# Patient Record
Sex: Male | Born: 2000 | Race: White | Hispanic: No | Marital: Single | Smoking: Never smoker
Health system: Southern US, Community
[De-identification: ages and names within clinical notes are randomized; demographics above are authoritative.]

## PROBLEM LIST (undated history)

## (undated) DIAGNOSIS — J45909 Unspecified asthma, uncomplicated: Secondary | ICD-10-CM

---
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
## 2019-03-27 ENCOUNTER — Other Ambulatory Visit: Payer: Self-pay

## 2019-03-27 ENCOUNTER — Emergency Department (HOSPITAL_BASED_OUTPATIENT_CLINIC_OR_DEPARTMENT_OTHER): Payer: Managed Care, Other (non HMO)

## 2019-03-27 ENCOUNTER — Emergency Department (HOSPITAL_BASED_OUTPATIENT_CLINIC_OR_DEPARTMENT_OTHER)
Admission: EM | Admit: 2019-03-27 | Discharge: 2019-03-27 | Disposition: A | Discharge status: Home / Self Care | Payer: Managed Care, Other (non HMO) | Attending: Emergency Medicine | Admitting: Emergency Medicine

## 2019-03-27 ENCOUNTER — Encounter (HOSPITAL_BASED_OUTPATIENT_CLINIC_OR_DEPARTMENT_OTHER): Payer: Self-pay

## 2019-03-27 DIAGNOSIS — Z79899 Other long term (current) drug therapy: Secondary | ICD-10-CM | POA: Diagnosis not present

## 2019-03-27 DIAGNOSIS — Z91010 Allergy to peanuts: Secondary | ICD-10-CM | POA: Insufficient documentation

## 2019-03-27 DIAGNOSIS — J45909 Unspecified asthma, uncomplicated: Secondary | ICD-10-CM | POA: Diagnosis not present

## 2019-03-27 DIAGNOSIS — W25XXXA Contact with sharp glass, initial encounter: Secondary | ICD-10-CM | POA: Insufficient documentation

## 2019-03-27 DIAGNOSIS — Y92828 Other wilderness area as the place of occurrence of the external cause: Secondary | ICD-10-CM | POA: Diagnosis not present

## 2019-03-27 DIAGNOSIS — Y998 Other external cause status: Secondary | ICD-10-CM | POA: Diagnosis not present

## 2019-03-27 DIAGNOSIS — Y9389 Activity, other specified: Secondary | ICD-10-CM | POA: Diagnosis not present

## 2019-03-27 DIAGNOSIS — S91312A Laceration without foreign body, left foot, initial encounter: Secondary | ICD-10-CM | POA: Diagnosis not present

## 2019-03-27 DIAGNOSIS — Z23 Encounter for immunization: Secondary | ICD-10-CM | POA: Insufficient documentation

## 2019-03-27 DIAGNOSIS — S91311A Laceration without foreign body, right foot, initial encounter: Secondary | ICD-10-CM

## 2019-03-27 HISTORY — DX: Unspecified asthma, uncomplicated: J45.909

## 2019-03-27 MED ORDER — TETANUS-DIPHTH-ACELL PERTUSSIS 5-2.5-18.5 LF-MCG/0.5 IM SUSP
0.5000 mL | Freq: Once | INTRAMUSCULAR | Status: AC
  Administered 2019-03-27: 0.5 mL via INTRAMUSCULAR
  Filled 2019-03-27: qty 0.5

## 2019-03-27 MED ORDER — SULFAMETHOXAZOLE-TRIMETHOPRIM 800-160 MG PO TABS: 1.0000 | ORAL_TABLET | Freq: Two times a day (BID) | ORAL | 0 refills | Status: AC

## 2019-03-27 MED ORDER — SULFAMETHOXAZOLE-TRIMETHOPRIM 800-160 MG PO TABS
1.0000 | ORAL_TABLET | Freq: Once | ORAL | Status: AC
  Administered 2019-03-27: 1 via ORAL
  Filled 2019-03-27: qty 1

## 2019-03-27 MED ORDER — LIDOCAINE HCL 1 % IJ SOLN
INTRAMUSCULAR | Status: AC
  Administered 2019-03-27: 21:00:00 20 mL
  Filled 2019-03-27: qty 20

## 2019-03-27 MED ORDER — LIDOCAINE-EPINEPHRINE-TETRACAINE (LET) SOLUTION: 3.0000 mL | Freq: Once | NASAL | Status: DC

## 2019-03-27 NOTE — ED Triage Notes (Addendum)
Pt states he stepped on broken glass ~1.5hours PTA-lac noted to bottom of left foot-4x4/gauze applied-NAD-steady gait

## 2019-03-27 NOTE — Discharge Instructions (Addendum)
1. Medications: Tylenol or ibuprofen for pain, usual home medications ° °2. Treatment: ice for swelling, keep wound clean with warm soap and water and keep bandage dry, do not submerge in water for 24 hours ° °3. Follow Up: Please return in 10-14 days to have your stitches/staples removed or sooner if you have concerns. Return to the emergency department for increased redness, drainage of pus from the wound ° ° °WOUND CARE ° Keep area clean and dry for 24 hours. Do not remove bandage, if applied. ° After 24 hours, remove bandage and wash wound gently with mild soap and warm water. Reapply a new bandage after cleaning wound, if directed.  ° Continue daily cleansing with soap and water until stitches/staples are removed. ° Do not apply any ointments or creams to the wound while stitches/staples are in place, as this may cause delayed healing. °Return if you experience any of the following signs of infection: Swelling, redness, pus drainage, streaking, fever >101.0 F ° Return if you experience excessive bleeding that does not stop after 15-20 minutes of constant, firm pressure. ° °

## 2019-03-27 NOTE — ED Provider Notes (Signed)
[facility] EMERGENCY DEPARTMENT Provider Note   CSN: [id number] Arrival date & time: 03/27/19  2010    History   Chief Complaint Chief Complaint  Patient presents with  . Foot Injury    HPI Eric Bullock is a 18 y.o. male has medical history of asthma presents emergency department today with chief complaint of laceration to left foot.  This happened 1/2 hours prior to arrival.  [place] pain on the bottom of his foot.  He pulled it out and saw there [place] and he was bleeding.  He has pain localized to laceration, he rates it 6/10 in severity.  He not take anything for pain prior to arrival.  Patient states tetanus immunization is not up-to-date.   History provided by patient with additional history obtained from chart review.     Past Medical History:  Diagnosis Date  . Asthma     Patient Active Problem List   Diagnosis Date Noted  . Other allergic rhinitis 11/25/2015  . 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 Atopic dermatitis 11/25/2015  . Moderate persistent asthma 11/25/2015    History reviewed. No pertinent surgical history.      Home Medications    Prior to Admission medications   Medication Sig Start Date End Date Taking? Authorizing Provider  albuterol (PROAIR HFA) 108 (90 Base) MCG/ACT inhaler Inhale 2 puffs into the lungs every 4 (four) hours as needed for wheezing or shortness of breath.    [provider]  beclomethasone (QVAR) 80 MCG/ACT inhaler Inhale 2 puffs into the lungs once. 01/12/16   Mikki SanteeBhatti, Sokun, MD  cetirizine (ZYRTEC) 10 MG tablet Take 10 mg by mouth daily.    [provider]  diphenhydrAMINE (BENADRYL) 12.5 MG/5ML elixir Take 12.5 mg by mouth 4 (four) times daily as needed.    [provider]  EPINEPHrine (EPIPEN 2-PAK) 0.3 mg/0.3 mL IJ SOAJ injection Inject 0.3 mLs (0.3 mg total) into the muscle once.  11/25/15   Mikki SanteeBhatti, Sokun, MD  flunisolide (NASALIDE) 25 MCG/ACT (0.025%) SOLN Place 2 sprays into the nose daily. 11/25/15   Mikki SanteeBhatti, Sokun, MD  ketotifen (ZADITOR) 0.025 % ophthalmic solution Place 1 drop into both eyes 2 (two) times daily. 01/12/16   Mikki SanteeBhatti, Sokun, MD  mometasone (ELOCON) 0.1 % ointment Apply topically daily. 11/11/15   Mikki SanteeBhatti, Sokun, MD  montelukast (SINGULAIR) 10 MG tablet Take 1 tablet (10 mg total) by mouth at bedtime. 11/25/15   Mikki SanteeBhatti, Sokun, MD  NONFORMULARY OR COMPOUNDED ITEM     [provider]  sulfamethoxazole-trimethoprim (BACTRIM DS) 800-160 MG tablet Take 1 tablet by mouth 2 (two) times daily for 5 days. 03/27/19 04/01/19  , Caroleen HammanKaitlyn E, PA-C    Family History No family history on file.  Social History Social History   Tobacco Use  . Smoking status: Never Smoker  . Smokeless tobacco: Never Used  Substance Use Topics  . Alcohol use: No  . Drug use: No     Allergies   Other and Peanut-containing drug products   Review of Systems Review of Systems  Constitutional: Negative for chills and fever.  Musculoskeletal: Negative for gait problem.  Skin: Positive for wound.  Neurological: Negative for weakness and numbness.     Physical Exam Updated Vital Signs BP 129/89 (BP Location: Left Arm)   Pulse 64   Temp 98.7 F (37.1 C) (Oral)  Resp 18   Ht 5\' 10"  (1.778 m)   Wt 68 kg   SpO2 100%   BMI 21.52 kg/m   Physical Exam Vitals signs and nursing note reviewed.  Constitutional:      Appearance: He is well-developed. He is not ill-appearing or toxic-appearing.  HENT:     Head: Normocephalic and atraumatic.     Nose: Nose normal.  Eyes:     General: No scleral icterus.       Right eye: No discharge.        Left eye: No discharge.     Conjunctiva/sclera: Conjunctivae normal.  Neck:     Musculoskeletal: Normal range of motion.     Vascular: No JVD.  Cardiovascular:     Rate and Rhythm: Normal rate and regular rhythm.      Pulses: Normal pulses.     Heart sounds: Normal heart sounds.  Pulmonary:     Effort: Pulmonary effort is normal.     Breath sounds: Normal breath sounds.  Abdominal:     General: There is no distension.  Musculoskeletal: Normal range of motion.       Feet:  Skin:    General: Skin is warm and dry.  Neurological:     Mental Status: He is oriented to person, place, and time.     GCS: GCS eye subscore is 4. GCS verbal subscore is 5. GCS motor subscore is 6.     Comments: Fluent speech, no facial droop.  Psychiatric:        Behavior: Behavior normal.      ED Treatments / Results  Labs (all labs ordered are listed, but only abnormal results are displayed) Labs Reviewed - No data to display  EKG None  Radiology Dg Foot Complete Left  Result Date: 03/27/2019 CLINICAL DATA:  Stepped on glass with laceration. Question foreign body. EXAM: LEFT FOOT - COMPLETE 3+ VIEW COMPARISON:  None. FINDINGS: No evidence of fracture, dislocation or radiopaque foreign object. IMPRESSION: Negative radiographs. Electronically Signed   By: Nelson Chimes M.D.   On: 03/27/2019 21:57    Procedures .Marland KitchenLaceration Repair  Date/Time: 03/27/2019 10:59 PM Performed by: Cherre Robins, PA-C Authorized by: Cherre Robins, PA-C   Consent:    Consent obtained:  Verbal   Consent given by:  Patient   Risks discussed:  Infection   Alternatives discussed:  No treatment Universal protocol:    Procedure explained and questions answered to patient or proxy's satisfaction: yes     Immediately prior to procedure, a time out was called: yes     Patient identity confirmed:  Verbally with patient and arm band Anesthesia (see MAR for exact dosages):    Anesthesia method:  None Laceration details:    Location:  Foot   Foot location:  Sole of L foot   Length (cm):  4 Repair type:    Repair type:  Simple Pre-procedure details:    Preparation:  Imaging obtained to evaluate for foreign bodies and patient  was prepped and draped in usual sterile fashion Exploration:    Hemostasis achieved with:  Direct pressure   Wound exploration: wound explored through full range of motion and entire depth of wound probed and visualized     Contaminated: no   Treatment:    Area cleansed with:  Saline   Amount of cleaning:  Standard   Irrigation solution:  Sterile saline   Irrigation method:  Pressure wash Skin repair:    Repair method:  Sutures  Suture size:  3-0   Suture material:  Prolene   Number of sutures:  5 Approximation:    Approximation:  Close Post-procedure details:    Dressing:  Bulky dressing   Patient tolerance of procedure:  Tolerated well, no immediate complications   (including critical care time)  Medications Ordered in ED Medications  lidocaine-EPINEPHrine-tetracaine (LET) solution (has no administration in time range)  sulfamethoxazole-trimethoprim (BACTRIM DS) 800-160 MG per tablet 1 tablet (has no administration in time range)  Tdap (BOOSTRIX) injection 0.5 mL (0.5 mLs Intramuscular Given 03/27/19 2029)  lidocaine (XYLOCAINE) 1 % (with pres) injection (20 mLs  Given by Other 03/27/19 2031)     Initial Impression / Assessment and Plan / ED Course  I have reviewed the triage vital signs and the nursing notes.  Pertinent labs & imaging results that were available during my care of the patient were reviewed by me and considered in my medical decision making (see chart for details).  Patient presents to the emergency department with laceration to the plantar aspect of right foot which occurred within 8 hours PTA . Patient nontoxic appearing, resting comfortably. X-ray obtained in area of laceration, no fractures/dislocations or apparent radiopaque foreign bodies. Pressure irrigation performed. Wound explored and base of wound visualized in a bloodless field without evidence of foreign body. Laceration repair per procedure note above, tolerated well. Tetanus updated at today's  visit.  Discharge home with prescription for Bactrim. Discussed suture home care as well as need for wound recheck and suture removal in 10-14 days.  I discussed results, treatment plan, need for follow-up, and return precautions with the patient including signs of infection. Provided opportunity for questions, patient confirmed understanding and is in agreement with plan.    This note was prepared using Dragon voice recognition software and may include unintentional dictation errors due to the inherent limitations of voice recognition software.    Final Clinical Impressions(s) / ED Diagnoses   Final diagnoses:  Laceration of right foot, initial encounter    ED Discharge Orders         Ordered    sulfamethoxazole-trimethoprim (BACTRIM DS) 800-160 MG tablet  2 times daily     03/27/19 2251           Sherene Sireslbrizze,  E, PA-C 03/27/19 2300    Rolan BuccoBelfi, Melanie, MD 03/27/19 2329

## 2020-10-08 IMAGING — DX LEFT FOOT - COMPLETE 3+ VIEW
3 series · 3 of 3 positions shown · non-contrast
Comparison: None.

CLINICAL DATA: Stepped on glass with laceration. Question foreign
body.

EXAM:
LEFT FOOT - COMPLETE 3+ VIEW

[foot ap]
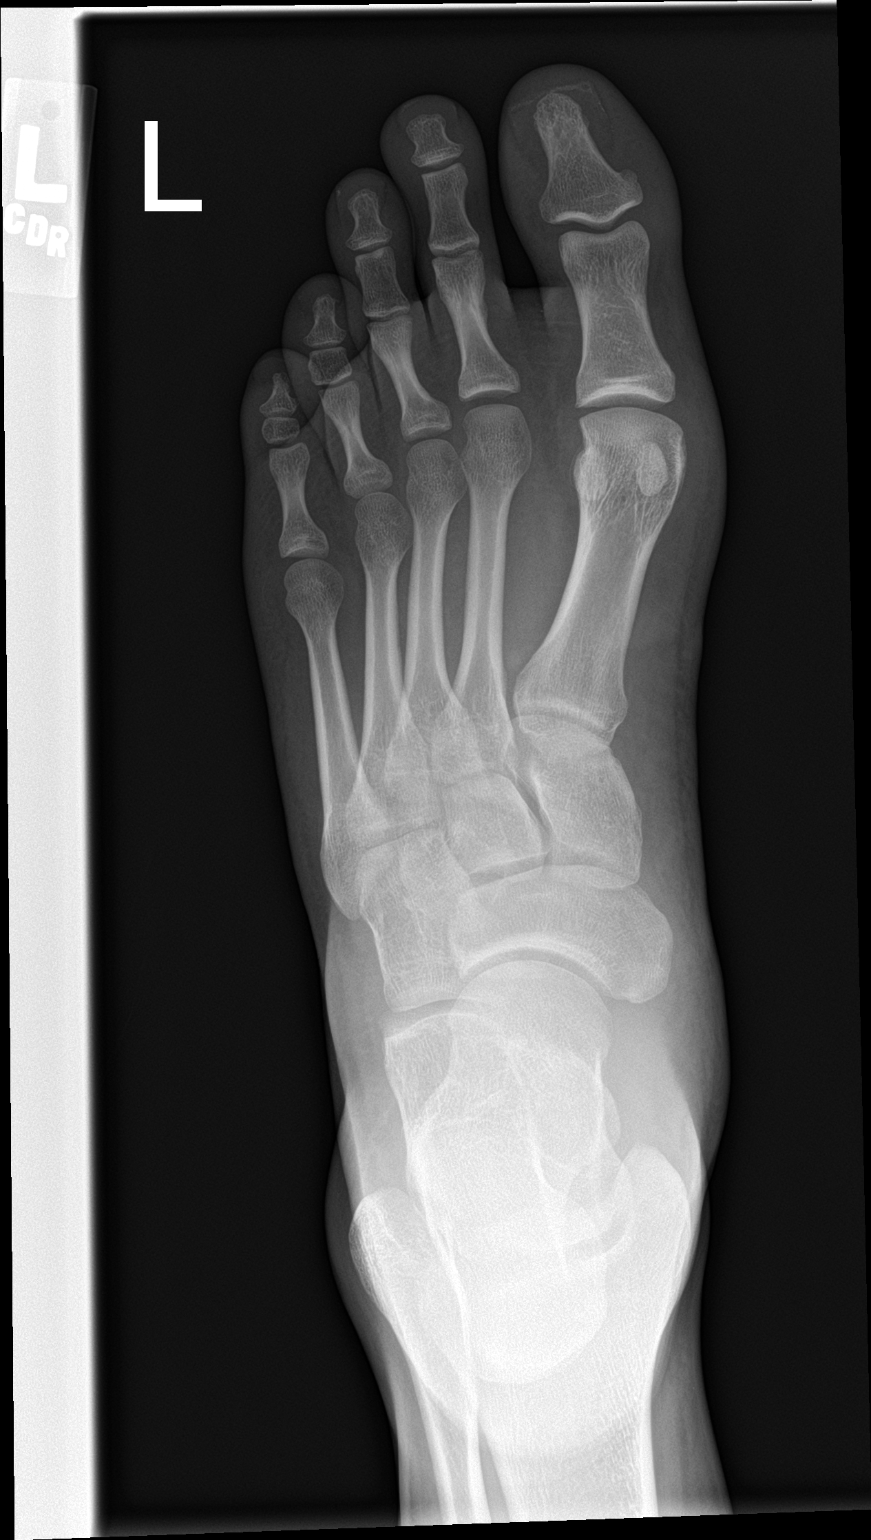

[foot obl]
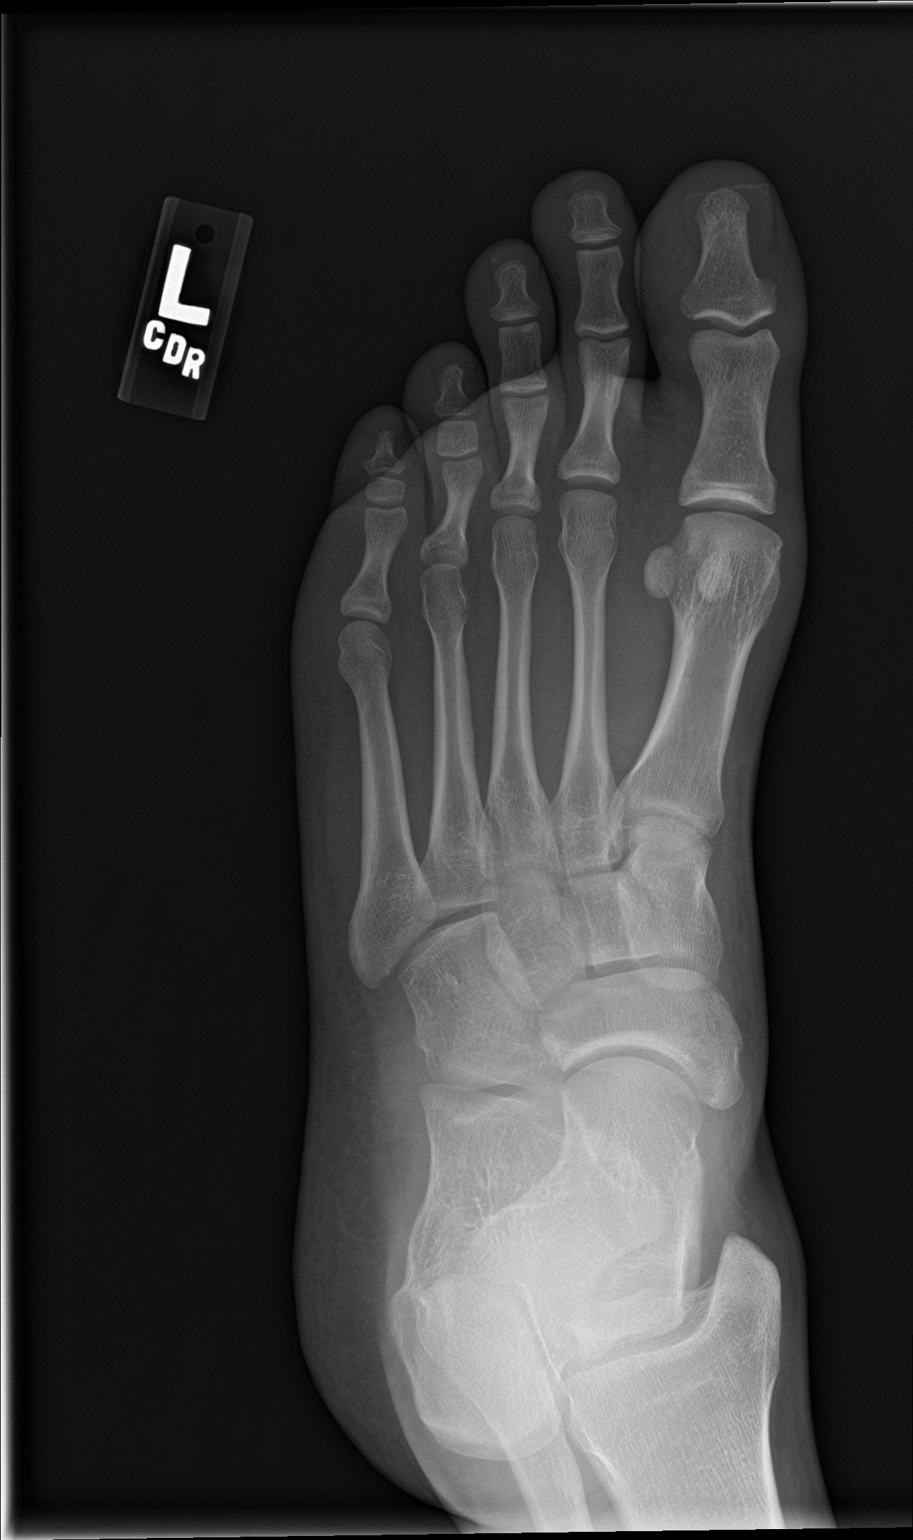

[foot lat]
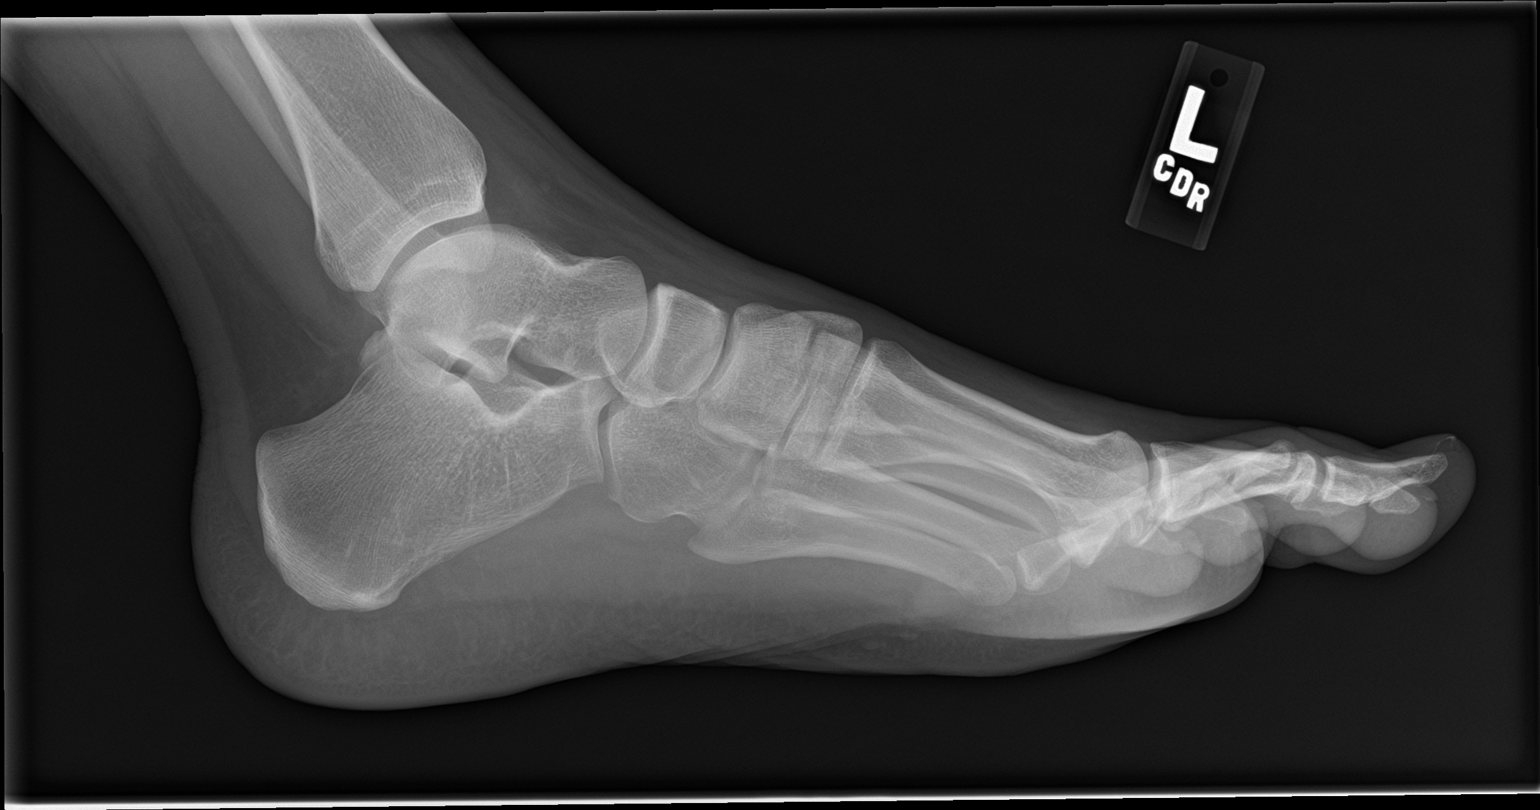

[3 of 3 positions shown; findings below may reference images not displayed]

FINDINGS: No evidence of fracture, dislocation or radiopaque foreign object.
IMPRESSION: Negative radiographs.
